# Patient Record
Sex: Male | Born: 1955 | Race: Black or African American | Hispanic: No | Marital: Single | Smoking: Current every day smoker
Health system: Southern US, Community
[De-identification: ages and names within clinical notes are randomized; demographics above are authoritative.]

## PROBLEM LIST (undated history)

## (undated) DIAGNOSIS — I10 Essential (primary) hypertension: Secondary | ICD-10-CM

---

## 2020-04-03 ENCOUNTER — Encounter: Payer: Self-pay | Admitting: *Deleted

## 2020-09-14 ENCOUNTER — Emergency Department: Payer: Medicare HMO

## 2020-09-14 ENCOUNTER — Other Ambulatory Visit: Payer: Self-pay

## 2020-09-14 ENCOUNTER — Emergency Department
Admission: EM | Admit: 2020-09-14 | Discharge: 2020-09-15 | Disposition: A | Discharge status: Home / Self Care | Payer: Medicare HMO | Attending: Emergency Medicine | Admitting: Emergency Medicine

## 2020-09-14 DIAGNOSIS — R55 Syncope and collapse: Secondary | ICD-10-CM | POA: Insufficient documentation

## 2020-09-14 DIAGNOSIS — T68XXXA Hypothermia, initial encounter: Secondary | ICD-10-CM | POA: Insufficient documentation

## 2020-09-14 DIAGNOSIS — Z0389 Encounter for observation for other suspected diseases and conditions ruled out: Secondary | ICD-10-CM | POA: Insufficient documentation

## 2020-09-14 DIAGNOSIS — F17210 Nicotine dependence, cigarettes, uncomplicated: Secondary | ICD-10-CM | POA: Insufficient documentation

## 2020-09-14 DIAGNOSIS — I10 Essential (primary) hypertension: Secondary | ICD-10-CM | POA: Insufficient documentation

## 2020-09-14 DIAGNOSIS — F1410 Cocaine abuse, uncomplicated: Secondary | ICD-10-CM | POA: Insufficient documentation

## 2020-09-14 DIAGNOSIS — I959 Hypotension, unspecified: Secondary | ICD-10-CM | POA: Insufficient documentation

## 2020-09-14 DIAGNOSIS — F1010 Alcohol abuse, uncomplicated: Secondary | ICD-10-CM

## 2020-09-14 DIAGNOSIS — X31XXXA Exposure to excessive natural cold, initial encounter: Secondary | ICD-10-CM | POA: Insufficient documentation

## 2020-09-14 DIAGNOSIS — W1830XA Fall on same level, unspecified, initial encounter: Secondary | ICD-10-CM | POA: Insufficient documentation

## 2020-09-14 HISTORY — DX: Essential (primary) hypertension: I10

## 2020-09-14 LAB — CBC WITH DIFFERENTIAL/PLATELET
Abs Immature Granulocytes: 0.02 10*3/uL (ref 0.00–0.07)
Basophils Absolute: 0 10*3/uL (ref 0.0–0.1)
Basophils Relative: 1 %
Eosinophils Absolute: 0 10*3/uL (ref 0.0–0.5)
Eosinophils Relative: 1 %
HCT: 41.2 % (ref 39.0–52.0)
Hemoglobin: 13.4 g/dL (ref 13.0–17.0)
Immature Granulocytes: 0 %
Lymphocytes Relative: 28 %
Lymphs Abs: 1.7 10*3/uL (ref 0.7–4.0)
MCH: 27 pg (ref 26.0–34.0)
MCHC: 32.5 g/dL (ref 30.0–36.0)
MCV: 82.9 fL (ref 80.0–100.0)
Monocytes Absolute: 0.8 10*3/uL (ref 0.1–1.0)
Monocytes Relative: 13 %
Neutro Abs: 3.6 10*3/uL (ref 1.7–7.7)
Neutrophils Relative %: 57 %
Platelets: 268 10*3/uL (ref 150–400)
RBC: 4.97 MIL/uL (ref 4.22–5.81)
RDW: 15.4 % (ref 11.5–15.5)
WBC: 6.2 10*3/uL (ref 4.0–10.5)
nRBC: 0 % (ref 0.0–0.2)

## 2020-09-14 MED ORDER — SODIUM CHLORIDE 0.9 % IV BOLUS
1000.0000 mL | Freq: Once | INTRAVENOUS | Status: AC
  Administered 2020-09-14: 1000 mL via INTRAVENOUS

## 2020-09-14 MED ORDER — THIAMINE HCL 100 MG/ML IJ SOLN
100.0000 mg | Freq: Once | INTRAMUSCULAR | Status: AC
  Administered 2020-09-14: 100 mg via INTRAVENOUS
  Filled 2020-09-14: qty 2

## 2020-09-14 NOTE — ED Triage Notes (Addendum)
EMS brought in from homeless shelter for witnessed syncopal episode by bystanders. Initial BP 60/30 with EMS, given NS 500cc en route, repeat BP 80/40. + ETOH. AAOx4 upon arrival. Hx cocaine abuse reported by shelter staff. Denies CP/SOB.   Patient reports drinking 2 pints of wine today and smoking crack this evening.

## 2020-09-14 NOTE — ED Provider Notes (Signed)
[facility] Emergency Department Provider Note  ____________________________________________   Event Date/Time   First MD Initiated Contact with Patient 09/14/20 2241     (approximate)  I have reviewed the triage vital signs and the nursing notes.   HISTORY  Chief Complaint Hypotension    HPI Sean Newman is a 65 y.o. male  With h/o HTN, here with low BP, syncope. History somewhat limited as pt does not remember what happened. Reports that he has had 2 pints of alcohol throughout the day today, as well as used cocaine. He states the last thing he remembers is being on a bench outside of the shelter. He reportedly was witnessed to fall to the ground by a bystander. Per EMS, pt had BP 60s systolic on their arrival, confused, but increasingly alert. BP improving with 500 cc given en route. He denies any pain. He states his mouth feels dry but otherwise denies complaints. Specifically, no CP, SOB.        Past Medical History:  Diagnosis Date  . Hypertension     There are no problems to display for this patient.     Prior to Admission medications   Not on File    Allergies Patient has no known allergies.  No family history on file.  Social History Social History   Tobacco Use  . Smoking status: Current Every Day Smoker    Types: Cigarettes  . Smokeless tobacco: Never Used  Vaping Use  . Vaping Use: Never used  Substance Use Topics  . Alcohol use: Yes    Comment: 2 pints of wine  . Drug use: Yes    Types: Marijuana, Cocaine    Review of Systems  Review of Systems  Constitutional: Positive for fatigue. Negative for chills and fever.  HENT: Negative for sore throat.   Respiratory: Negative for shortness of breath.   Cardiovascular: Negative for chest pain.  Gastrointestinal: Negative for abdominal pain.  Genitourinary: Negative for flank pain.  Musculoskeletal: Negative for neck pain.  Skin: Negative for rash and wound.   Allergic/Immunologic: Negative for immunocompromised state.  Neurological: Positive for syncope and weakness. Negative for numbness.  Hematological: Does not bruise/bleed easily.     ____________________________________________  PHYSICAL EXAM:      VITAL SIGNS: ED Triage Vitals  Enc Vitals Group     BP 09/14/20 2237 (!) 88/62     Pulse Rate 09/14/20 2237 62     Resp 09/14/20 2237 16     Temp 09/14/20 2253 (!) 94.9 F (34.9 C)     Temp Source 09/14/20 2253 Rectal     SpO2 09/14/20 2237 99 %     Weight 09/14/20 2238 146 lb (66.2 kg)     Height 09/14/20 2238 5\' 10"  (1.778 m)     Head Circumference --      Peak Flow --      Pain Score 09/14/20 2238 0     Pain Loc --      Pain Edu? --      Excl. in GC? --      Physical Exam Vitals and nursing note reviewed.  Constitutional:      General: He is not in acute distress.    Appearance: He is well-developed.  HENT:     Head: Normocephalic and atraumatic.  Eyes:     Conjunctiva/sclera: Conjunctivae normal.  Cardiovascular:     Rate and Rhythm: Normal rate and regular rhythm.     Heart sounds: Normal heart sounds.  Pulmonary:     Effort: Pulmonary effort is normal. No respiratory distress.     Breath sounds: No wheezing.  Abdominal:     General: There is no distension.  Musculoskeletal:     Cervical back: Neck supple.  Skin:    General: Skin is warm.     Capillary Refill: Capillary refill takes less than 2 seconds.     Findings: No rash.  Neurological:     Mental Status: He is alert and oriented to person, place, and time.     Motor: No abnormal muscle tone.       ____________________________________________   LABS (all labs ordered are listed, but only abnormal results are displayed)  Labs Reviewed  URINE DRUG SCREEN, QUALITATIVE ([facility] ONLY) - Abnormal; Notable for the following components:      Result Value   Cocaine Metabolite,Ur [place] POSITIVE (*)    Cannabinoid 50 Ng, Ur [place] POSITIVE (*)    All other  components within normal limits  COMPREHENSIVE METABOLIC PANEL - Abnormal; Notable for the following components:   Glucose, Bld 117 (*)    BUN 7 (*)    Calcium 7.9 (*)    All other components within normal limits  CBC WITH DIFFERENTIAL/PLATELET  ETHANOL  MAGNESIUM  TSH  TROPONIN I (HIGH SENSITIVITY)    ____________________________________________  EKG: Normal sinus rhythm, VR 61. PR 173, QRS 105, QTc 515. No acute St elevations. No ischemia or infarct. ________________________________________  RADIOLOGY All imaging, including plain films, CT scans, and ultrasounds, independently reviewed by me, and interpretations confirmed via formal radiology reads.  ED MD interpretation:   CT Head: NAICA CXR: Pending  Official radiology report(s): No results found.  ____________________________________________  PROCEDURES   Procedure(s) performed (including Critical Care):  Procedures  ____________________________________________  INITIAL IMPRESSION / MDM / ASSESSMENT AND PLAN / ED COURSE  As part of my medical decision making, I reviewed the following data within the electronic MEDICAL RECORD NUMBER Nursing notes reviewed and incorporated, Old chart reviewed, Notes from prior ED visits, and [place] Controlled Substance Database       *Sean Newman was evaluated in Emergency Department on 09/18/2020 for the symptoms described in the history of present illness. He was evaluated in the context of the global COVID-19 pandemic, which necessitated consideration that the patient might be at risk for infection with the SARS-CoV-2 virus that causes COVID-19. Institutional protocols and algorithms that pertain to the evaluation of patients at risk for COVID-19 are in a state of rapid change based on information released by regulatory bodies including the CDC and federal and state organizations. These policies and algorithms were followed during the patient's care in the ED.  Some ED evaluations and  interventions may be delayed as a result of limited staffing during the pandemic.*     Medical Decision Making:  65 yo M here with syncope, hypotension, and hypothermia. Suspect hypovolemia related to poor PO intake, alcohol/substance abuse, malnutrition with possible component of environmental exposure. He denies any preceding fever or infectious sx. Will check labs, imaging. BAIR hugger placed for hypothermia.  ____________________________________________  FINAL CLINICAL IMPRESSION(S) / ED DIAGNOSES  Final diagnoses:  Hypothermia, initial encounter  Alcohol consumption binge drinking  Cocaine abuse (HCC)     MEDICATIONS GIVEN DURING THIS VISIT:  Medications  sodium chloride 0.9 % bolus 1,000 mL (0 mLs Intravenous Stopped 09/14/20 2322)  thiamine (B-1) injection 100 mg (100 mg Intravenous Given 09/14/20 2320)     ED Discharge Orders    None  Note:  This document was prepared using Dragon voice recognition software and may include unintentional dictation errors.   Shaune Pollack, MD 09/18/20 [phone]

## 2020-09-15 DIAGNOSIS — T68XXXA Hypothermia, initial encounter: Secondary | ICD-10-CM | POA: Diagnosis not present

## 2020-09-15 LAB — COMPREHENSIVE METABOLIC PANEL
ALT: 11 U/L (ref 0–44)
AST: 18 U/L (ref 15–41)
Albumin: 3.8 g/dL (ref 3.5–5.0)
Alkaline Phosphatase: 55 U/L (ref 38–126)
Anion gap: 9 (ref 5–15)
BUN: 7 mg/dL — ABNORMAL LOW (ref 8–23)
CO2: 25 mmol/L (ref 22–32)
Calcium: 7.9 mg/dL — ABNORMAL LOW (ref 8.9–10.3)
Chloride: 105 mmol/L (ref 98–111)
Creatinine, Ser: 0.74 mg/dL (ref 0.61–1.24)
GFR, Estimated: >60 mL/min (ref >60)
Glucose, Bld: 117 mg/dL — ABNORMAL HIGH (ref 70–99)
Potassium: 4.2 mmol/L (ref 3.5–5.1)
Sodium: 139 mmol/L (ref 135–145)
Total Bilirubin: 1 mg/dL (ref 0.3–1.2)
Total Protein: 6.9 g/dL (ref 6.5–8.1)

## 2020-09-15 LAB — URINE DRUG SCREEN, QUALITATIVE (ARMC ONLY)
Amphetamines, Ur Screen: NOT DETECTED
Barbiturates, Ur Screen: NOT DETECTED
Benzodiazepine, Ur Scrn: NOT DETECTED
Cannabinoid 50 Ng, Ur ~~LOC~~: POSITIVE — AB
Cocaine Metabolite,Ur ~~LOC~~: POSITIVE — AB
MDMA (Ecstasy)Ur Screen: NOT DETECTED
Methadone Scn, Ur: NOT DETECTED
Opiate, Ur Screen: NOT DETECTED
Phencyclidine (PCP) Ur S: NOT DETECTED
Tricyclic, Ur Screen: NOT DETECTED

## 2020-09-15 LAB — TROPONIN I (HIGH SENSITIVITY): Troponin I (High Sensitivity): 5 ng/L (ref <18)

## 2020-09-15 LAB — MAGNESIUM: Magnesium: 1.7 mg/dL (ref 1.7–2.4)

## 2020-09-15 LAB — ETHANOL: Alcohol, Ethyl (B): <10 mg/dL (ref <10)

## 2020-09-15 LAB — TSH: TSH: 2.363 u[IU]/mL (ref 0.350–4.500)

## 2020-09-15 NOTE — ED Notes (Signed)
Patient reports that he does not recall his ride's phone number and did not bring his cellphone. Reports that he "will have to wait until the shelter opens in the morning to call the office". Charge nurse and MD aware. Per MD, patient can wait in the lobby at this time. VSS. Alert and oriented. Ambulates without assistance.

## 2020-09-15 NOTE — ED Provider Notes (Signed)
Assumed care of this patient from Dr. Erma Heritage at 4 PM.  66 year old male here with a syncopal event in the setting of hypotension, hypothermia in the setting of alcohol and cocaine abuse.  Patient's vitals significantly improved after IV fluids and a couple of hours with a bear hugger.  That has been discontinued.  Patient BP is 135/89, temp of 98, pulse of 56, normal work of breathing and sats.  Patient ambulatory with no difficulties.  UDS positive for cocaine and cannabinoids P alcohol level negative.  Blood work with no signs of severe dehydration or electrolyte derangements.  Head CT with no acute intracranial abnormalities.  Patient stable for discharge.  Drug and alcohol abuse counseling provided.  Discussed my standard return precautions    I have personally reviewed the images performed during this visit and I agree with the Radiologist's read.   Interpretation by Radiologist:  CT Head Wo Contrast  Result Date: 09/14/2020 CLINICAL DATA:  Dizziness nonspecific. EXAM: CT HEAD WITHOUT CONTRAST TECHNIQUE: Contiguous axial images were obtained from the base of the skull through the vertex without intravenous contrast. COMPARISON:  None. FINDINGS: Brain: No evidence of large-territorial acute infarction. No parenchymal hemorrhage. No mass lesion. No extra-axial collection. No mass effect or midline shift. No hydrocephalus. Basilar cisterns are patent. Vascular: No hyperdense vessel. Skull: No acute fracture or focal lesion. Sinuses/Orbits: Paranasal sinuses and mastoid air cells are clear. The orbits are unremarkable. Other: Multiple tiny foci of gas noted within the soft tissues (3:2, 4, 5, 16) likely related to the venous system and IV placement. IMPRESSION: No acute intracranial abnormality. Electronically Signed   By: Tish Frederickson M.D.   On: 09/14/2020 23:15   DG Chest Portable 1 View  Result Date: 09/14/2020 CLINICAL DATA:  Hypothermia EXAM: PORTABLE CHEST 1 VIEW COMPARISON:  None. FINDINGS:  Hyperinflation. Heart is normal size. Aortic atherosclerosis. No confluent opacities or effusions. No acute bony abnormality. IMPRESSION: Hyperinflation.  No active disease. Electronically Signed   By: Charlett Nose M.D.   On: 09/14/2020 23:20      Nita Sickle, MD 09/15/20 [phone]

## 2022-06-15 IMAGING — CT CT HEAD W/O CM
3 series · 15 of 47 positions shown, 18 images · non-contrast
Comparison: None.

CLINICAL DATA: Dizziness nonspecific.

EXAM:
CT HEAD WITHOUT CONTRAST
TECHNIQUE: Contiguous axial images were obtained from the base of the skull
through the vertex without intravenous contrast.

[Series 2: head wo · axial · 0.41mm/px · z∈[+554,+684]mm · 9 of 32 slices shown, 12 images]
[im 3/32  brain]
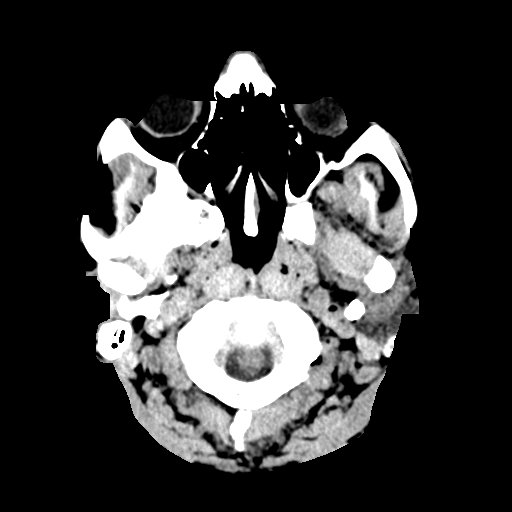
[im 3/32  bone]
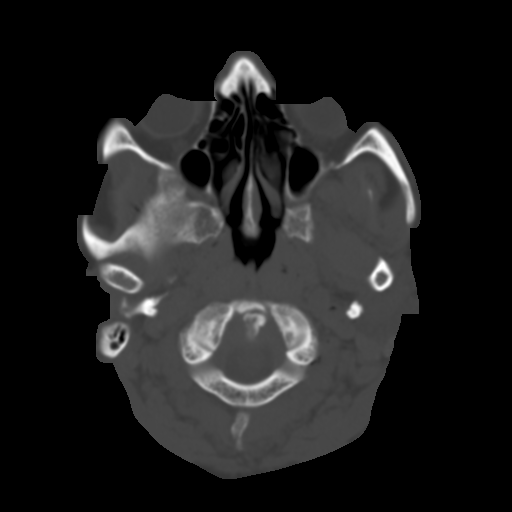
[im 6/32  brain]
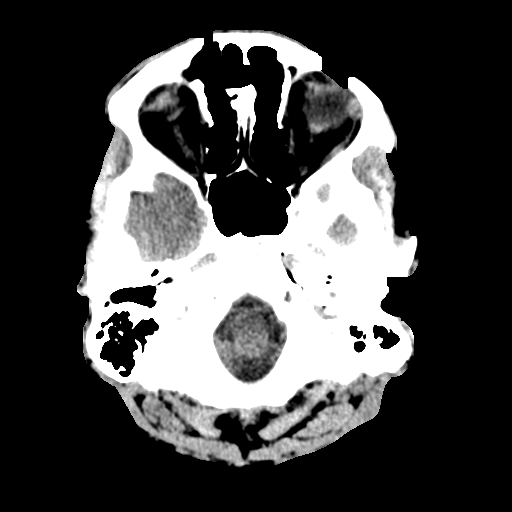
[im 9/32  brain]
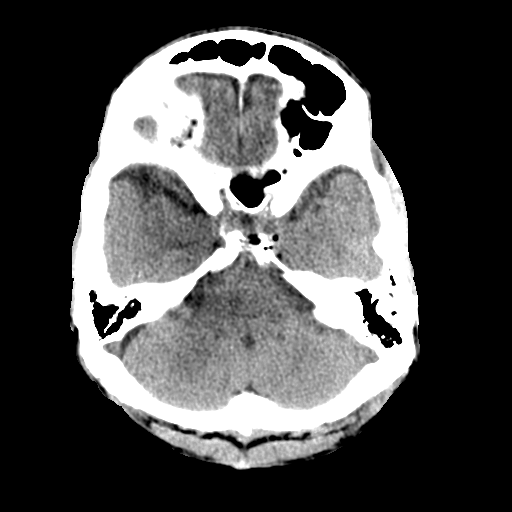
[im 12/32  brain]
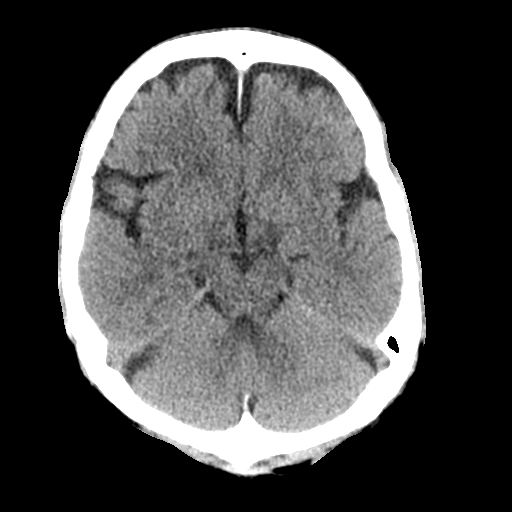
[im 17/32  brain]
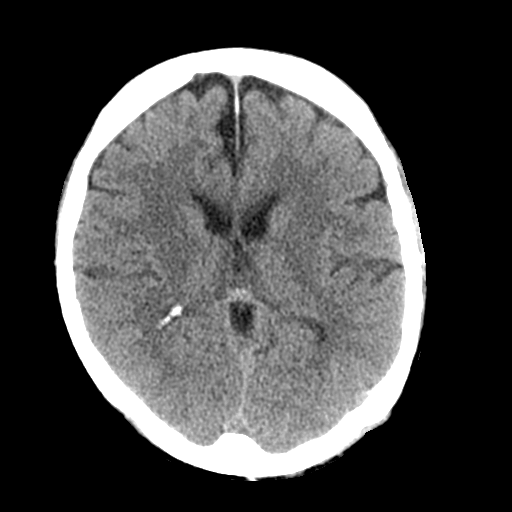
[im 17/32  bone]
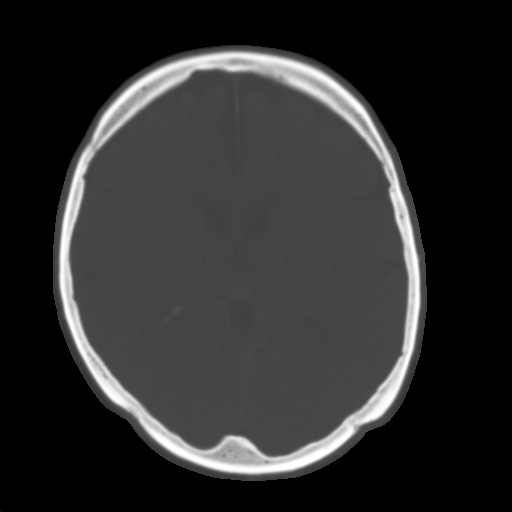
[im 20/32  brain]
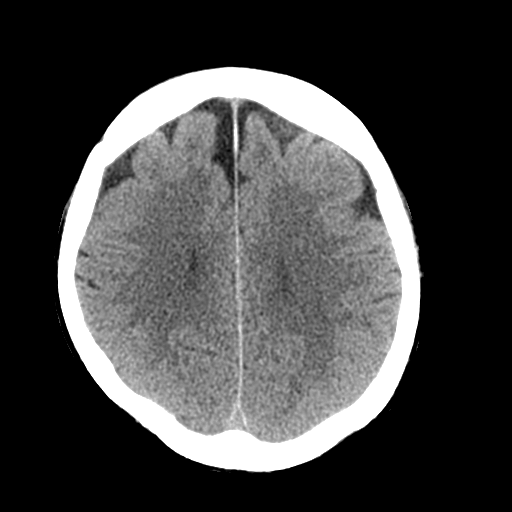
[im 23/32  brain]
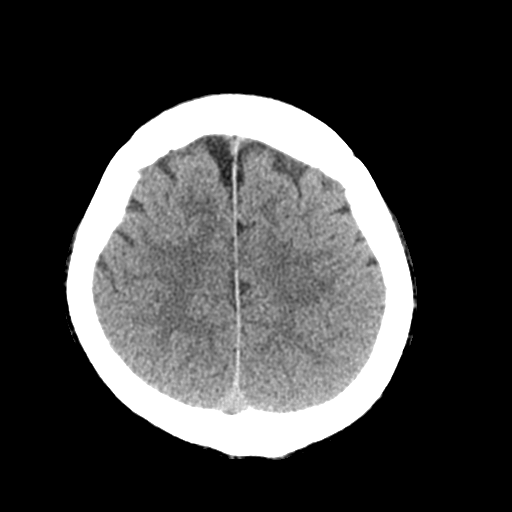
[im 26/32  brain]
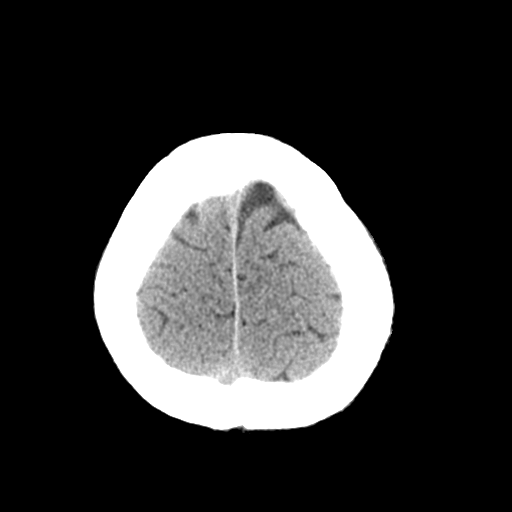
[im 29/32  brain]
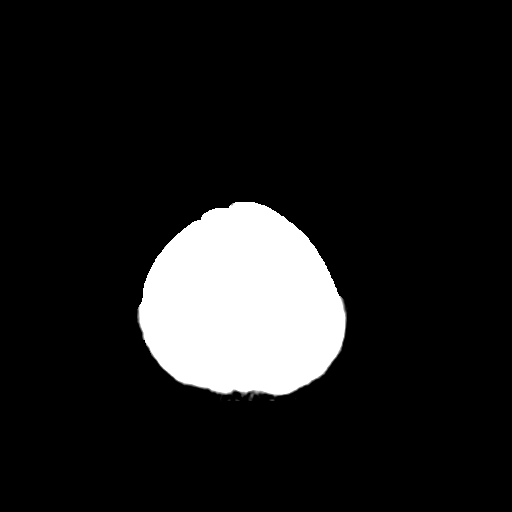
[im 29/32  bone]
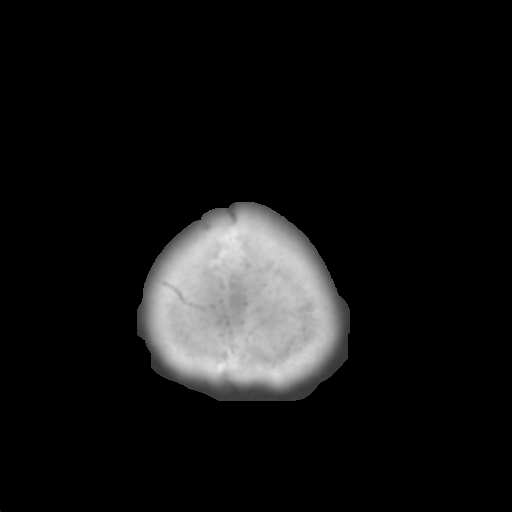

[Series 4: coronal soft tissue · coronal · 0.38mm/px · 3 of 74 slices shown]
[im 25/74  brain]
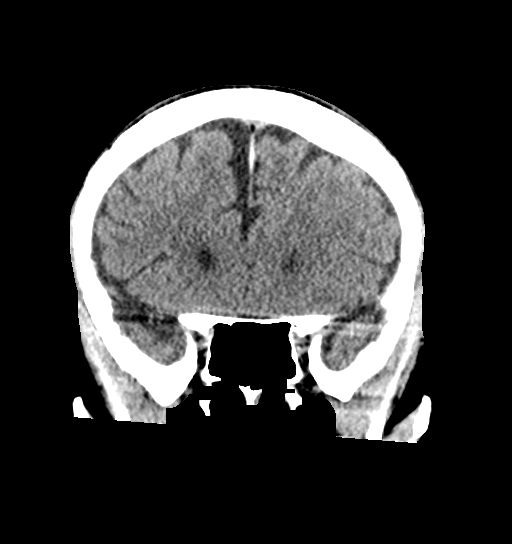
[im 33/74  brain]
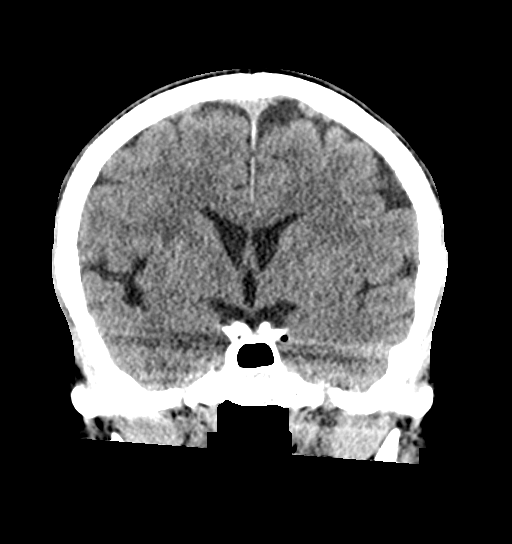
[im 41/74  brain]
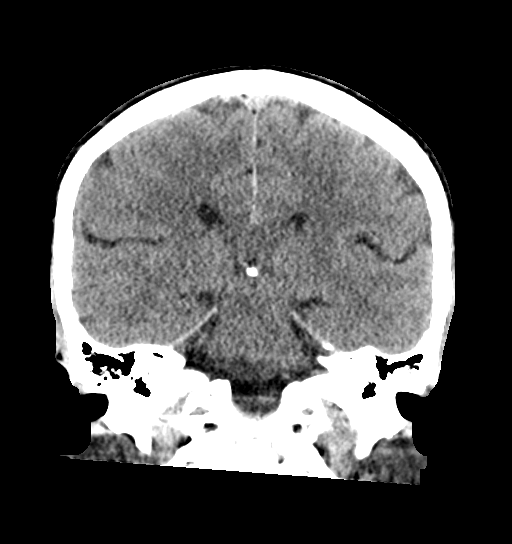

[Series 5: sagittal soft tissue · sagittal · 0.35mm/px · 3 of 62 slices shown]
[im 21/62  brain]
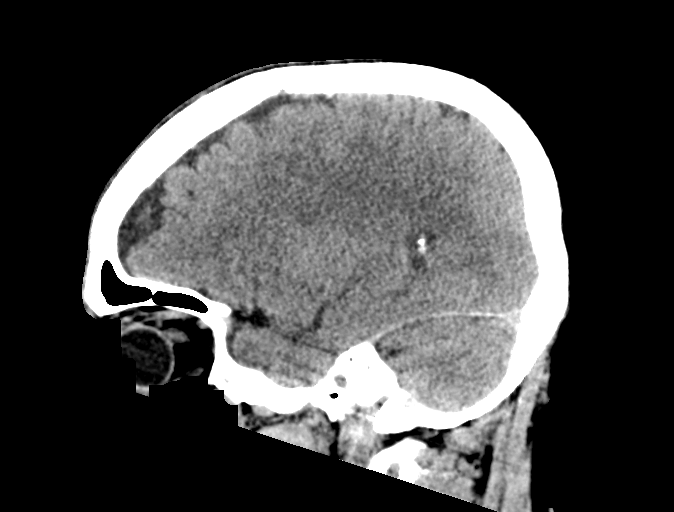
[im 31/62  brain]
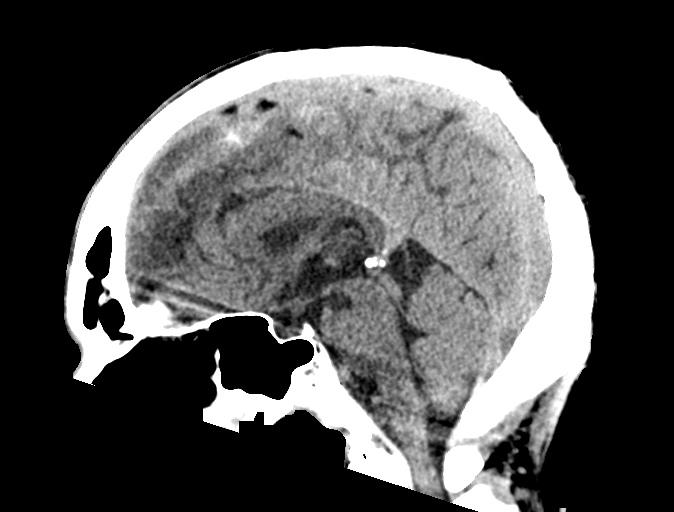
[im 41/62  brain]
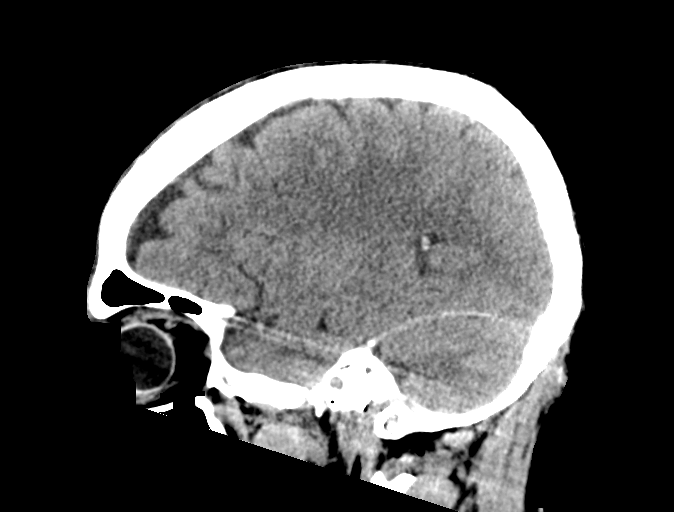

[15 of 47 positions shown; findings below may reference images not displayed]

FINDINGS: Brain:

No evidence of large-territorial acute infarction. No parenchymal
hemorrhage. No mass lesion. No extra-axial collection.

No mass effect or midline shift. No hydrocephalus. Basilar cisterns
are patent.

Vascular: No hyperdense vessel.

Skull: No acute fracture or focal lesion.

Sinuses/Orbits: Paranasal sinuses and mastoid air cells are clear.
The orbits are unremarkable.

Other: Multiple tiny foci of gas noted within the soft tissues ([DATE],
4, 5, 16) likely related to the venous system and IV placement.
IMPRESSION: No acute intracranial abnormality.

## 2022-08-17 ENCOUNTER — Ambulatory Visit: Admission: EM | Admit: 2022-08-17 | Discharge: 2022-08-17 | Discharge status: Absent without leave | Payer: Medicare HMO
